# Patient Record
Sex: Male | Born: 2000 | Race: Black or African American | Hispanic: No | Marital: Single | Smoking: Never smoker
Health system: Southern US, Community
[De-identification: ages and names within clinical notes are randomized; demographics above are authoritative.]

## PROBLEM LIST (undated history)

## (undated) DIAGNOSIS — Z969 Presence of functional implant, unspecified: Secondary | ICD-10-CM

---
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
## 2022-05-23 ENCOUNTER — Ambulatory Visit
Admission: EM | Admit: 2022-05-23 | Discharge: 2022-05-23 | Disposition: A | Discharge status: Home / Self Care | Payer: Self-pay | Attending: Internal Medicine | Admitting: Internal Medicine

## 2022-05-23 ENCOUNTER — Ambulatory Visit (INDEPENDENT_AMBULATORY_CARE_PROVIDER_SITE_OTHER): Payer: Self-pay

## 2022-05-23 DIAGNOSIS — S61411A Laceration without foreign body of right hand, initial encounter: Secondary | ICD-10-CM

## 2022-05-23 DIAGNOSIS — Z23 Encounter for immunization: Secondary | ICD-10-CM

## 2022-05-23 DIAGNOSIS — M79641 Pain in right hand: Secondary | ICD-10-CM

## 2022-05-23 MED ORDER — TETANUS-DIPHTH-ACELL PERTUSSIS 5-2.5-18.5 LF-MCG/0.5 IM SUSY
0.5000 mL | PREFILLED_SYRINGE | Freq: Once | INTRAMUSCULAR | Status: AC
  Administered 2022-05-23: 0.5 mL via INTRAMUSCULAR

## 2022-05-23 NOTE — ED Provider Notes (Signed)
[name]    CSN: [id number] Arrival date & time: 05/23/22  1241      History   Chief Complaint Chief Complaint  Patient presents with   Laceration    HPI Gabriel Hughes is a 21 y.o. male.   Patient reports with right hand injury that occurred approximately 1 AM early this morning.  Patient reports that he got angry so he punched a brick wall.  He has a laceration to his right fifth knuckle as well.  Denies any numbness or tingling.  Has full range of motion of hand.  Does not report taking medications to help alleviate symptoms.  Patient is not sure of last tetanus vaccine.   Laceration   Past Medical History:  Diagnosis Date   Retained orthopedic hardware 06/2016   right ankle    Patient Active Problem List   Diagnosis Date Noted   High risk heterosexual behavior 08/09/2019   Open ankle fracture 01/13/2016   ADHD (attention deficit hyperactivity disorder) 04/11/2011    Past Surgical History:  Procedure Laterality Date   CAST APPLICATION Right 8/88/2800   Procedure: CAST APPLICATION;  Surgeon: Melina Schools, MD;  Location: [place];  Service: Orthopedics;  Laterality: Right;   HARDWARE REMOVAL Right 07/03/2016   Procedure: Removal of deep implants Right Ankle;  Surgeon: Wylene Simmer, MD;  Location: [place];  Service: Orthopedics;  Laterality: Right;   I & D EXTREMITY Right 01/13/2016   Procedure: IRRIGATION AND DEBRIDEMENT RIGHT ANKLE;  Surgeon: Melina Schools, MD;  Location: [place];  Service: Orthopedics;  Laterality: Right;   I & D EXTREMITY Right 01/15/2016   Procedure: IRRIGATION AND DEBRIDEMENT OPEN FRACTURE ;  Surgeon: Wylene Simmer, MD;  Location: [place];  Service: Orthopedics;  Laterality: Right;   OPEN REDUCTION INTERNAL FIXATION (ORIF) DISTAL RADIAL FRACTURE Right 01/15/2016   Procedure: OPEN REDUCTION INTERNAL FIXATION (ORIF) RIGHT DISTAL TIBIAL RADIAL FRACTURE;  Surgeon: Wylene Simmer, MD;  Location: [place];  Service: Orthopedics;   Laterality: Right;       Home Medications    Prior to Admission medications   Medication Sig Start Date End Date Taking? Authorizing Provider  amoxicillin (AMOXIL) 500 MG capsule Take 1 capsule (500 mg total) by mouth 3 (three) times daily. 09/20/21   Francene Finders, PA-C  levocetirizine (XYZAL) 5 MG tablet Take 1 tablet (5 mg total) by mouth every evening. 11/28/20   Scot Jun, FNP  promethazine-dextromethorphan (PROMETHAZINE-DM) 6.25-15 MG/5ML syrup Take 5 mLs by mouth 4 (four) times daily as needed for cough. 11/28/20   Scot Jun, FNP    Family History History reviewed. No pertinent family history.  Social History Social History   Tobacco Use   Smoking status: Passive Smoke Exposure - Never Smoker   Smokeless tobacco: Never   Tobacco comments:    father smokes outside  Vaping Use   Vaping Use: Some days  Substance Use Topics   Alcohol use: No   Drug use: Yes    Types: Marijuana     Allergies   Patient has no known allergies.   Review of Systems Review of Systems Per HPI  Physical Exam Triage Vital Signs ED Triage Vitals  Enc Vitals Group     BP 05/23/22 1348 (!) 123/50     Pulse Rate 05/23/22 1348 (!) 52     Resp 05/23/22 1348 19     Temp 05/23/22 1348 97.8 F (36.6 C)     Temp Source 05/23/22 1348 Oral  SpO2 05/23/22 1348 98 %     Weight --      Height --      Head Circumference --      Peak Flow --      Pain Score 05/23/22 1353 3     Pain Loc --      Pain Edu? --      Excl. in GC? --    No data found.  Updated Vital Signs BP (!) 123/50   Pulse (!) 52   Temp 97.8 F (36.6 C) (Oral)   Resp 19   SpO2 98%   Visual Acuity Right Eye Distance:   Left Eye Distance:   Bilateral Distance:    Right Eye Near:   Left Eye Near:    Bilateral Near:     Physical Exam Constitutional:      General: He is not in acute distress.    Appearance: Normal appearance. He is not toxic-appearing or diaphoretic.  HENT:     Head:  Normocephalic and atraumatic.  Eyes:     Extraocular Movements: Extraocular movements intact.     Conjunctiva/sclera: Conjunctivae normal.  Pulmonary:     Effort: Pulmonary effort is normal.  Musculoskeletal:     Comments: Tenderness to palpation overlying right fifth knuckle.  Associated mild swelling as well.  Grip strength 5/5.  Neurovascular intact.  Skin:    Comments: Approximately 1 inch superficial laceration present overlying right fifth knuckle.  No obvious tendons noted. Bleeding is controlled.  Neurological:     General: No focal deficit present.     Mental Status: He is alert and oriented to person, place, and time. Mental status is at baseline.  Psychiatric:        Mood and Affect: Mood normal.        Behavior: Behavior normal.        Thought Content: Thought content normal.        Judgment: Judgment normal.      UC Treatments / Results  Labs (all labs ordered are listed, but only abnormal results are displayed) Labs Reviewed - No data to display  EKG   Radiology DG Hand Complete Right  Result Date: 05/23/2022 CLINICAL DATA:  Hand pain. Laceration to right fifth finger proximal knuckle. Punched a brick wall last night. EXAM: RIGHT HAND - COMPLETE 3+ VIEW COMPARISON:  None Available. FINDINGS: Normal bone mineralization. Joint spaces are preserved. No acute fracture is seen. No dislocation. IMPRESSION: No acute fracture. Electronically Signed   By: Neita Garnet M.D.   On: 05/23/2022 14:28    Procedures Procedures (including critical care time)  Medications Ordered in UC Medications  Tdap (BOOSTRIX) injection 0.5 mL (0.5 mLs Intramuscular Given 05/23/22 1443)    Initial Impression / Assessment and Plan / UC Course  I have reviewed the triage vital signs and the nursing notes.  Pertinent labs & imaging results that were available during my care of the patient were reviewed by me and considered in my medical decision making (see chart for details).     Right  hand x-ray was negative for any acute bony abnormality.  Laceration to right hand is very superficial so no closure is needed.  Wound cleansed and nonadherent dressing applied.  Patient advised to monitor for signs signs of infection and keep covered until healed over.  Advised ice application as well.  Patient was advised to follow-up if any symptoms persist or worsen.  Patient verbalized understanding and was agreeable with plan. Final Clinical Impressions(s) /  UC Diagnoses   Final diagnoses:  Laceration of right hand without foreign body, initial encounter  Right hand pain     Discharge Instructions      Your x-ray was normal.  A dressing has been applied.  Please keep clean and covered until healed over.  Monitor for signs of infection that include increased redness, swelling, pus and follow-up if this occurs.  Recommend ice application as well.  Tetanus vaccine has been updated today.  Please follow-up if symptoms persist or worsen.     ED Prescriptions   None    PDMP not reviewed this encounter.   Gustavus Bryant, [place] 05/23/22 1446

## 2022-05-23 NOTE — Discharge Instructions (Signed)
Your x-ray was normal.  A dressing has been applied.  Please keep clean and covered until healed over.  Monitor for signs of infection that include increased redness, swelling, pus and follow-up if this occurs.  Recommend ice application as well.  Tetanus vaccine has been updated today.  Please follow-up if symptoms persist or worsen.

## 2022-05-23 NOTE — ED Triage Notes (Signed)
Pt presents to uc for laceration to right pinky finger proximal knuckle. Pt reports he was intoxicated last night and punched a brick wall. Pt arrived with bandage in place, removed bandage 2-3 cm lac bleeding controled. Not sure if up to date on tetanus

## 2022-07-01 ENCOUNTER — Encounter: Payer: Self-pay | Admitting: Emergency Medicine

## 2022-07-01 ENCOUNTER — Ambulatory Visit
Admission: EM | Admit: 2022-07-01 | Discharge: 2022-07-01 | Disposition: A | Discharge status: Home / Self Care | Payer: Self-pay | Attending: Physician Assistant | Admitting: Physician Assistant

## 2022-07-01 DIAGNOSIS — R112 Nausea with vomiting, unspecified: Secondary | ICD-10-CM

## 2022-07-01 DIAGNOSIS — R051 Acute cough: Secondary | ICD-10-CM

## 2022-07-01 DIAGNOSIS — J069 Acute upper respiratory infection, unspecified: Secondary | ICD-10-CM

## 2022-07-01 MED ORDER — PROMETHAZINE-DM 6.25-15 MG/5ML PO SYRP: 5.0000 mL | ORAL_SOLUTION | Freq: Four times a day (QID) | ORAL | 0 refills | Status: AC | PRN

## 2022-07-01 NOTE — Discharge Instructions (Signed)
Advised to take the Phenergan DM, 1 to 2 teaspoons every 6-8 hours as needed for cough congestion and nausea.  Advised take ibuprofen or Tylenol as needed for discomfort or fever.  Advised to follow-up with PCP or return to urgent care if symptoms fail to improve.

## 2022-07-01 NOTE — ED Triage Notes (Signed)
Pt is present today with HA, cough, abdominal pain and congestion x3 days

## 2022-07-01 NOTE — ED Provider Notes (Signed)
[name]    CSN: [id number] Arrival date & time: 07/01/22  [id number]      History   Chief Complaint Chief Complaint  Patient presents with   Headache   Cough    congestion    HPI Gabriel Hughes is a 21 y.o. male.   21 year old male presents with cough and nausea.  Patient indicates for the past 3 days he has been having some upper respiratory congestion with rhinitis and postnasal drip mainly clear to yellow production.  Patient also indicates he has been having some chest congestion with intermittent cough with production being clear to yellow.  Patient also indicates he has been having some stomach cramping with nausea and 1 episode of vomiting which happened 2 days ago.  He also indicates that he has been having some diarrhea associated with the nausea over the past couple days.  He has not been having any fever but he has been having some chills and fatigue.  Patient indicates he has been using some Mucinex DM OTC without much improvement of his symptoms.  Patient indicates he is tolerating fluids well   Headache Associated symptoms: cough, diarrhea and nausea   Cough Associated symptoms: headaches     Past Medical History:  Diagnosis Date   Retained orthopedic hardware 06/2016   right ankle    Patient Active Problem List   Diagnosis Date Noted   High risk heterosexual behavior 08/09/2019   Open ankle fracture 01/13/2016   ADHD (attention deficit hyperactivity disorder) 04/11/2011    Past Surgical History:  Procedure Laterality Date   CAST APPLICATION Right 0000000   Procedure: CAST APPLICATION;  Surgeon: Melina Schools, MD;  Location: [place];  Service: Orthopedics;  Laterality: Right;   HARDWARE REMOVAL Right 07/03/2016   Procedure: Removal of deep implants Right Ankle;  Surgeon: Wylene Simmer, MD;  Location: [place];  Service: Orthopedics;  Laterality: Right;   I & D EXTREMITY Right 01/13/2016   Procedure: IRRIGATION AND DEBRIDEMENT  RIGHT ANKLE;  Surgeon: Melina Schools, MD;  Location: [place];  Service: Orthopedics;  Laterality: Right;   I & D EXTREMITY Right 01/15/2016   Procedure: IRRIGATION AND DEBRIDEMENT OPEN FRACTURE ;  Surgeon: Wylene Simmer, MD;  Location: [place];  Service: Orthopedics;  Laterality: Right;   OPEN REDUCTION INTERNAL FIXATION (ORIF) DISTAL RADIAL FRACTURE Right 01/15/2016   Procedure: OPEN REDUCTION INTERNAL FIXATION (ORIF) RIGHT DISTAL TIBIAL RADIAL FRACTURE;  Surgeon: Wylene Simmer, MD;  Location: [place];  Service: Orthopedics;  Laterality: Right;       Home Medications    Prior to Admission medications   Medication Sig Start Date End Date Taking? Authorizing Provider  amoxicillin (AMOXIL) 500 MG capsule Take 1 capsule (500 mg total) by mouth 3 (three) times daily. 09/20/21   Francene Finders, PA-C  levocetirizine (XYZAL) 5 MG tablet Take 1 tablet (5 mg total) by mouth every evening. 11/28/20   Scot Jun, FNP  promethazine-dextromethorphan (PROMETHAZINE-DM) 6.25-15 MG/5ML syrup Take 5 mLs by mouth 4 (four) times daily as needed for cough. 07/01/22   Nyoka Lint, PA-C    Family History History reviewed. No pertinent family history.  Social History Social History   Tobacco Use   Smoking status: Passive Smoke Exposure - Never Smoker   Smokeless tobacco: Never   Tobacco comments:    father smokes outside  Vaping Use   Vaping Use: Some days  Substance Use Topics   Alcohol use: No   Drug use: Yes  Types: Marijuana     Allergies   Patient has no known allergies.   Review of Systems Review of Systems  Respiratory:  Positive for cough.   Gastrointestinal:  Positive for diarrhea and nausea.  Neurological:  Positive for headaches.     Physical Exam Triage Vital Signs ED Triage Vitals  Enc Vitals Group     BP 07/01/22 0919 133/82     Pulse Rate 07/01/22 0919 (!) 52     Resp 07/01/22 0919 17     Temp 07/01/22 0919 98.3 F (36.8 C)     Temp src --      SpO2 07/01/22 0919 98  %     Weight --      Height --      Head Circumference --      Peak Flow --      Pain Score 07/01/22 0918 5     Pain Loc --      Pain Edu? --      Excl. in GC? --    No data found.  Updated Vital Signs BP 133/82   Pulse (!) 52   Temp 98.3 F (36.8 C)   Resp 17   SpO2 98%   Visual Acuity Right Eye Distance:   Left Eye Distance:   Bilateral Distance:    Right Eye Near:   Left Eye Near:    Bilateral Near:     Physical Exam Constitutional:      Appearance: He is well-developed.  HENT:     Right Ear: Tympanic membrane and ear canal normal.     Left Ear: Tympanic membrane and ear canal normal.     Mouth/Throat:     Mouth: Mucous membranes are moist.     Pharynx: Oropharynx is clear. No posterior oropharyngeal erythema.  Cardiovascular:     Rate and Rhythm: Normal rate and regular rhythm.     Heart sounds: Normal heart sounds.  Pulmonary:     Effort: Pulmonary effort is normal.     Breath sounds: Normal breath sounds and air entry. No wheezing, rhonchi or rales.  Abdominal:     General: Abdomen is flat. Bowel sounds are increased.     Palpations: Abdomen is soft.     Tenderness: There is no abdominal tenderness. There is no guarding or rebound.  Lymphadenopathy:     Cervical: No cervical adenopathy.  Neurological:     Mental Status: He is alert.      UC Treatments / Results  Labs (all labs ordered are listed, but only abnormal results are displayed) Labs Reviewed - No data to display  EKG   Radiology No results found.  Procedures Procedures (including critical care time)  Medications Ordered in UC Medications - No data to display  Initial Impression / Assessment and Plan / UC Course  I have reviewed the triage vital signs and the nursing notes.  Pertinent labs & imaging results that were available during my care of the patient were reviewed by me and considered in my medical decision making (see chart for details).    Plan: 1.  The acute upper  respiratory infection be treated with the following: A.  Phenergan DM, 1 to 2 teaspoons every 6-8 hours needed for cough and congestion. 2.  The nausea and vomiting will be treated with the following: A.  Phenergan DM 1 to 2 teaspoons every 6-8 hours needed for cough and congestion and to control the nausea. 3.  The acute cough will be treated  with the following: A.  Phenergan DM, 1 to 2 teaspoons every 6-8 hours needed for cough or congestion. 4.  Advised follow-up PCP or return to urgent care if symptoms fail to improve. Final Clinical Impressions(s) / UC Diagnoses   Final diagnoses:  Acute upper respiratory infection  Nausea and vomiting, unspecified vomiting type  Acute cough     Discharge Instructions      Advised to take the Phenergan DM, 1 to 2 teaspoons every 6-8 hours as needed for cough congestion and nausea.  Advised take ibuprofen or Tylenol as needed for discomfort or fever.  Advised to follow-up with PCP or return to urgent care if symptoms fail to improve.     ED Prescriptions     Medication Sig Dispense Auth. Provider   promethazine-dextromethorphan (PROMETHAZINE-DM) 6.25-15 MG/5ML syrup Take 5 mLs by mouth 4 (four) times daily as needed for cough. 120 mL Ellsworth Lennox, PA-C      PDMP not reviewed this encounter.   Ellsworth Lennox, PA-C 07/01/22 [phone]

## 2022-07-27 ENCOUNTER — Ambulatory Visit
Admission: EM | Admit: 2022-07-27 | Discharge: 2022-07-27 | Disposition: A | Discharge status: Home / Self Care | Payer: Self-pay | Attending: Nurse Practitioner | Admitting: Nurse Practitioner

## 2022-07-27 DIAGNOSIS — Z1152 Encounter for screening for COVID-19: Secondary | ICD-10-CM | POA: Insufficient documentation

## 2022-07-27 DIAGNOSIS — J069 Acute upper respiratory infection, unspecified: Secondary | ICD-10-CM | POA: Insufficient documentation

## 2022-07-27 MED ORDER — BENZONATATE 200 MG PO CAPS: 200.0000 mg | ORAL_CAPSULE | Freq: Three times a day (TID) | ORAL | 0 refills | Status: AC | PRN

## 2022-07-27 NOTE — ED Provider Notes (Signed)
Gabriel Hughes    CSN: [id number] Arrival date & time: 07/27/22  1128      History   Chief Complaint Chief Complaint  Patient presents with   URI    HPI Gabriel Hughes is a 22 y.o. male who presents for evaluation of URI symptoms for 4 days. Patient reports associated symptoms of cough, congestion, postnasal drip, sore throat. Denies N/V/D, ears, ear pain, shortness of breath. Patient does not have a hx of asthma.  He is an active smoker.   No known sick contacts and no recent travel. Pt is vaccinated for COVID. Pt is vaccinated for flu this season. Pt has taken the musinex OTC for symptoms. Pt has no other concerns at this time.    URI Presenting symptoms: congestion, cough and sore throat     Past Medical History:  Diagnosis Date   Retained orthopedic hardware 06/2016   right ankle    Patient Active Problem List   Diagnosis Date Noted   High risk heterosexual behavior 08/09/2019   Open ankle fracture 01/13/2016   ADHD (attention deficit hyperactivity disorder) 04/11/2011    Past Surgical History:  Procedure Laterality Date   CAST APPLICATION Right 01/13/2016   Procedure: CAST APPLICATION;  Surgeon: Venita Lick, MD;  Location[facility];  Service: Orthopedics;  Laterality: Right;   HARDWARE REMOVAL Right 07/03/2016   Procedure: Removal of deep implants Right Ankle;  Surgeon: Toni Arthurs, MD;  Location: [facility];  Service: Orthopedics;  Laterality: Right;   I & D EXTREMITY Right 01/13/2016   Procedure: IRRIGATION AND DEBRIDEMENT RIGHT ANKLE;  Surgeon: Venita Lick, MD;  Location: [facility];  Service: Orthopedics;  Laterality: Right;   I & D EXTREMITY Right 01/15/2016   Procedure: IRRIGATION AND DEBRIDEMENT OPEN FRACTURE ;  Surgeon: Toni Arthurs, MD;  Location[facility];  Service: Orthopedics;  Laterality: Right;   OPEN REDUCTION INTERNAL FIXATION (ORIF) DISTAL RADIAL FRACTURE Right 01/15/2016   Procedure: OPEN REDUCTION INTERNAL FIXATION (ORIF) RIGHT  DISTAL TIBIAL RADIAL FRACTURE;  Surgeon: Toni Arthurs, MD;  Location: [facility];  Service: Orthopedics;  Laterality: Right;       Home Medications    Prior to Admission medications   Medication Sig Start Date End Date Taking? Authorizing Provider  benzonatate (TESSALON) 200 MG capsule Take 1 capsule (200 mg total) by mouth 3 (three) times daily as needed for cough. 07/27/22  Yes Radford Pax, NP  amoxicillin (AMOXIL) 500 MG capsule Take 1 capsule (500 mg total) by mouth 3 (three) times daily. 09/20/21   Tomi Bamberger, PA-C  levocetirizine (XYZAL) 5 MG tablet Take 1 tablet (5 mg total) by mouth every evening. 11/28/20   Bing Neighbors, FNP  promethazine-dextromethorphan (PROMETHAZINE-DM) 6.25-15 MG/5ML syrup Take 5 mLs by mouth 4 (four) times daily as needed for cough. 07/01/22   Ellsworth Lennox, PA-C    Family History History reviewed. No pertinent family history.  Social History Social History   Tobacco Use   Smoking status: Passive Smoke Exposure - Never Smoker   Smokeless tobacco: Never   Tobacco comments:    father smokes outside  Vaping Use   Vaping Use: Some days  Substance Use Topics   Alcohol use: No   Drug use: Yes    Types: Marijuana     Allergies   Patient has no known allergies.   Review of Systems Review of Systems  HENT:  Positive for congestion and sore throat.   Respiratory:  Positive for cough.  Physical Exam Triage Vital Signs ED Triage Vitals  Enc Vitals Group     BP 07/27/22 1152 119/67     Pulse Rate 07/27/22 1151 (!) 56     Resp --      Temp 07/27/22 1151 97.9 F (36.6 C)     Temp Source 07/27/22 1151 Oral     SpO2 07/27/22 1151 97 %     Weight --      Height --      Head Circumference --      Peak Flow --      Pain Score 07/27/22 1150 4     Pain Loc --      Pain Edu? --      Excl. in GC? --    No data found.  Updated Vital Signs BP 119/67   Pulse (!) 56   Temp 97.9 F (36.6 C) (Oral)   SpO2 97%   Visual Acuity Right  Eye Distance:   Left Eye Distance:   Bilateral Distance:    Right Eye Near:   Left Eye Near:    Bilateral Near:     Physical Exam Vitals and nursing note reviewed.  Constitutional:      General: He is not in acute distress.    Appearance: Normal appearance. He is not ill-appearing or toxic-appearing.  HENT:     Head: Normocephalic and atraumatic.     Right Ear: Tympanic membrane and ear canal normal.     Left Ear: Tympanic membrane and ear canal normal.     Nose: Congestion present.     Mouth/Throat:     Mouth: Mucous membranes are moist.     Pharynx: Posterior oropharyngeal erythema present.  Eyes:     Pupils: Pupils are equal, round, and reactive to light.  Cardiovascular:     Rate and Rhythm: Normal rate and regular rhythm.     Heart sounds: Normal heart sounds.  Pulmonary:     Effort: Pulmonary effort is normal.     Breath sounds: Normal breath sounds.  Musculoskeletal:     Cervical back: Normal range of motion and neck supple.  Lymphadenopathy:     Cervical: No cervical adenopathy.  Skin:    General: Skin is warm and dry.  Neurological:     General: No focal deficit present.     Mental Status: He is alert and oriented to person, place, and time.  Psychiatric:        Mood and Affect: Mood normal.        Behavior: Behavior normal.      UC Treatments / Results  Labs (all labs ordered are listed, but only abnormal results are displayed) Labs Reviewed  SARS CORONAVIRUS 2 (TAT 6-24 HRS)    EKG   Radiology No results found.  Procedures Procedures (including critical Hughes time)  Medications Ordered in UC Medications - No data to display  Initial Impression / Assessment and Plan / UC Course  I have reviewed the triage vital signs and the nursing notes.  Pertinent labs & imaging results that were available during my Hughes of the patient were reviewed by me and considered in my medical decision making (see chart for details).     Discussed with patient  viral illness and symptomatic treatment  COVID PCR [place].  Cough Rest and fluids Follow up with PCP in 2-3 days for re-check  ER precautions reviewed and pt verbalized understanding  Final Clinical Impressions(s) / UC Diagnoses   Final diagnoses:  Viral  upper respiratory tract infection     Discharge Instructions      Your symptoms and exam are consistent for a viral illness. Please treat your symptoms with over the counter tylenol or ibuprofen, humidifier, and rest. Viral illnesses can last 7-14 days. Please follow up with your PCP if your symptoms are not improving. Please go to the ER for any worsening symptoms. This includes but is not limited to fever you can not control with tylenol or ibuprofen, you are not able to stay hydrated, you have shortness of breath or chest pain.  Thank you for choosing [facility] for your healthcare needs. I hope you feel better soon!    ED Prescriptions     Medication Sig Dispense Auth. Provider   benzonatate (TESSALON) 200 MG capsule Take 1 capsule (200 mg total) by mouth 3 (three) times daily as needed for cough. 20 capsule Melynda Ripple, NP      PDMP not reviewed this encounter.   Melynda Ripple, NP 07/27/22 [phone]

## 2022-07-27 NOTE — Discharge Instructions (Signed)
Your symptoms and exam are consistent for a viral illness. Please treat your symptoms with over the counter tylenol or ibuprofen, humidifier, and rest. Viral illnesses can last 7-14 days. Please follow up with your PCP if your symptoms are not improving. Please go to the ER for any worsening symptoms. This includes but is not limited to fever you can not control with tylenol or ibuprofen, you are not able to stay hydrated, you have shortness of breath or chest pain.  Thank you for choosing [facility] for your healthcare needs. I hope you feel better soon!  

## 2022-07-27 NOTE — ED Triage Notes (Signed)
Pt presents with sinus congestion and productive cough X 4 days.

## 2022-07-28 LAB — SARS CORONAVIRUS 2 (TAT 6-24 HRS): SARS Coronavirus 2: NEGATIVE

## 2022-07-29 ENCOUNTER — Encounter (HOSPITAL_COMMUNITY): Payer: Self-pay

## 2022-07-29 ENCOUNTER — Emergency Department (HOSPITAL_COMMUNITY)
Admission: EM | Admit: 2022-07-29 | Discharge: 2022-07-29 | Disposition: A | Discharge status: Home / Self Care | Payer: Self-pay | Attending: Emergency Medicine | Admitting: Emergency Medicine

## 2022-07-29 ENCOUNTER — Other Ambulatory Visit: Payer: Self-pay

## 2022-07-29 DIAGNOSIS — R197 Diarrhea, unspecified: Secondary | ICD-10-CM

## 2022-07-29 DIAGNOSIS — K529 Noninfective gastroenteritis and colitis, unspecified: Secondary | ICD-10-CM | POA: Insufficient documentation

## 2022-07-29 DIAGNOSIS — Z1152 Encounter for screening for COVID-19: Secondary | ICD-10-CM | POA: Insufficient documentation

## 2022-07-29 LAB — URINALYSIS, ROUTINE W REFLEX MICROSCOPIC
Bacteria, UA: NONE SEEN
Bilirubin Urine: NEGATIVE
Glucose, UA: NEGATIVE mg/dL
Hgb urine dipstick: NEGATIVE
Ketones, ur: NEGATIVE mg/dL
Leukocytes,Ua: NEGATIVE
Nitrite: NEGATIVE
Protein, ur: 30 mg/dL — AB
Specific Gravity, Urine: 1.023 (ref 1.005–1.030)
pH: 8 (ref 5.0–8.0)

## 2022-07-29 LAB — COMPREHENSIVE METABOLIC PANEL
ALT: 19 U/L (ref 0–44)
AST: 29 U/L (ref 15–41)
Albumin: 4.3 g/dL (ref 3.5–5.0)
Alkaline Phosphatase: 58 U/L (ref 38–126)
Anion gap: 10 (ref 5–15)
BUN: 14 mg/dL (ref 6–20)
CO2: 23 mmol/L (ref 22–32)
Calcium: 9.3 mg/dL (ref 8.9–10.3)
Chloride: 105 mmol/L (ref 98–111)
Creatinine, Ser: 1.01 mg/dL (ref 0.61–1.24)
GFR, Estimated: >60 mL/min (ref >60)
Glucose, Bld: 103 mg/dL — ABNORMAL HIGH (ref 70–99)
Potassium: 4.1 mmol/L (ref 3.5–5.1)
Sodium: 138 mmol/L (ref 135–145)
Total Bilirubin: 1.4 mg/dL — ABNORMAL HIGH (ref 0.3–1.2)
Total Protein: 7.1 g/dL (ref 6.5–8.1)

## 2022-07-29 LAB — CBC
HCT: 48.8 % (ref 39.0–52.0)
Hemoglobin: 16 g/dL (ref 13.0–17.0)
MCH: 30.5 pg (ref 26.0–34.0)
MCHC: 32.8 g/dL (ref 30.0–36.0)
MCV: 93.1 fL (ref 80.0–100.0)
Platelets: 143 10*3/uL — ABNORMAL LOW (ref 150–400)
RBC: 5.24 MIL/uL (ref 4.22–5.81)
RDW: 12.1 % (ref 11.5–15.5)
WBC: 7 10*3/uL (ref 4.0–10.5)
nRBC: 0 % (ref 0.0–0.2)

## 2022-07-29 LAB — RESP PANEL BY RT-PCR (RSV, FLU A&B, COVID)  RVPGX2
Influenza A by PCR: NEGATIVE
Influenza B by PCR: NEGATIVE
Resp Syncytial Virus by PCR: NEGATIVE
SARS Coronavirus 2 by RT PCR: NEGATIVE

## 2022-07-29 LAB — LIPASE, BLOOD: Lipase: 39 U/L (ref 11–51)

## 2022-07-29 MED ORDER — ONDANSETRON HCL 4 MG PO TABS: 4.0000 mg | ORAL_TABLET | Freq: Four times a day (QID) | ORAL | 0 refills | Status: AC

## 2022-07-29 MED ORDER — ONDANSETRON 4 MG PO TBDP
4.0000 mg | ORAL_TABLET | Freq: Once | ORAL | Status: AC
  Administered 2022-07-29: 4 mg via ORAL
  Filled 2022-07-29: qty 1

## 2022-07-29 MED ORDER — ONDANSETRON HCL 4 MG PO TABS: 4.0000 mg | ORAL_TABLET | Freq: Four times a day (QID) | ORAL | 0 refills | Status: DC

## 2022-07-29 MED ORDER — ACETAMINOPHEN 325 MG PO TABS
650.0000 mg | ORAL_TABLET | Freq: Once | ORAL | Status: AC
  Administered 2022-07-29: 650 mg via ORAL
  Filled 2022-07-29: qty 2

## 2022-07-29 MED ORDER — DICYCLOMINE HCL 20 MG PO TABS: 20.0000 mg | ORAL_TABLET | Freq: Two times a day (BID) | ORAL | 0 refills | Status: AC

## 2022-07-29 MED ORDER — DICYCLOMINE HCL 20 MG PO TABS: 20.0000 mg | ORAL_TABLET | Freq: Two times a day (BID) | ORAL | 0 refills | Status: DC

## 2022-07-29 MED ORDER — DICYCLOMINE HCL 20 MG PO TABS
20.0000 mg | ORAL_TABLET | Freq: Once | ORAL | Status: AC
  Administered 2022-07-29: 20 mg via ORAL
  Filled 2022-07-29: qty 1

## 2022-07-29 NOTE — ED Provider Triage Note (Signed)
Emergency Medicine Provider Triage Evaluation Note  Gabriel Hughes , a 22 y.o. male  was evaluated in triage.  Pt complains of cough and abdominal pain   Review of Systems  Positive: Cough and abdominal pain Negative:   Physical Exam  BP 111/60 (BP Location: Right Arm)   Pulse 72   Temp 98.1 F (36.7 C)   Resp 16   Ht 5\' 7"  (1.702 m)   Wt 55.2 kg   SpO2 100%   BMI 19.06 kg/m  Gen:   Awake, no distress  Resp:  Normal effort  MSK:   Moves extremities without difficulty  Other:    Medical Decision Making  Medically screening exam initiated at 2:30 PM.  Appropriate orders placed.  Gabriel Hughes was informed that the remainder of the evaluation will be completed by another provider, this initial triage assessment does not replace that evaluation, and the importance of remaining in the ED until their evaluation is complete.     Gabriel Hughes, [place] 07/29/22 1431

## 2022-07-29 NOTE — ED Triage Notes (Signed)
Pt c/o vomiting this morning. Chills, abdominal pain, nasal congestionx4-5d. Pt vomited on the floor in triage, because he couldn't make it to his emesis bag.

## 2022-07-29 NOTE — Discharge Instructions (Addendum)
You were seen in the emergency department for your vomiting and your diarrhea.  Your workup showed no signs of severe infection or dehydration.  You likely have a viral infection causing your symptoms so we did send a urine drug test for you today and you can follow this up on your patient portal.  You can continue to take Zofran as needed for nausea and Tylenol or Bentyl as needed for your abdominal pain.  You should follow-up with your primary doctor in the next few days to have your symptoms rechecked.  You should return to the emergency department if you are having fevers, repetitive vomiting despite the nausea medication, significantly worsening abdominal pain or any other new or concerning symptoms.
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
## 2022-09-18 ENCOUNTER — Encounter: Payer: Self-pay | Admitting: Radiology

## 2022-10-25 ENCOUNTER — Ambulatory Visit
Admission: EM | Admit: 2022-10-25 | Discharge: 2022-10-25 | Disposition: A | Discharge status: Home / Self Care | Payer: Self-pay | Attending: Internal Medicine | Admitting: Internal Medicine

## 2022-10-25 DIAGNOSIS — Z20822 Contact with and (suspected) exposure to covid-19: Secondary | ICD-10-CM | POA: Insufficient documentation

## 2022-10-25 DIAGNOSIS — R110 Nausea: Secondary | ICD-10-CM | POA: Insufficient documentation

## 2022-10-25 DIAGNOSIS — J029 Acute pharyngitis, unspecified: Secondary | ICD-10-CM | POA: Insufficient documentation

## 2022-10-25 DIAGNOSIS — B349 Viral infection, unspecified: Secondary | ICD-10-CM | POA: Insufficient documentation

## 2022-10-25 LAB — POCT INFLUENZA A/B
Influenza A, POC: NEGATIVE
Influenza B, POC: NEGATIVE

## 2022-10-25 LAB — POCT RAPID STREP A (OFFICE): Rapid Strep A Screen: NEGATIVE

## 2022-10-25 MED ORDER — ONDANSETRON 4 MG PO TBDP: 4.0000 mg | ORAL_TABLET | Freq: Three times a day (TID) | ORAL | 0 refills | Status: DC | PRN

## 2022-10-25 NOTE — ED Provider Notes (Signed)
[name]    CSN: [id number] Arrival date & time: 10/25/22  1043      History   Chief Complaint Chief Complaint  Patient presents with   Headache   Abdominal Pain   Cough    HPI Gabriel Hughes is a 22 y.o. male.   Patient presents with cough, headache, nausea, diarrhea, sore throat, nasal congestion that started about 2 days ago.  Reports known sick contact with similar symptoms.  Denies any fever at home.  Denies chest pain, shortness of breath, blood in stool or emesis, ear pain.  Patient has taken Tylenol for symptoms.  Patient has been able to keep food and fluids down.   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   H/O keloid of skin 05/08/2020   High risk heterosexual behavior 08/09/2019   Open ankle fracture 01/13/2016   ADHD (attention deficit hyperactivity disorder) 04/11/2011    Past Surgical History:  Procedure Laterality Date   CAST APPLICATION Right 01/13/2016   Procedure: CAST APPLICATION;  Surgeon: Venita Lickahari Brooks, MD;  [facility];  Service: Orthopedics;  Laterality: Right;   HARDWARE REMOVAL Right 07/03/2016   Procedure: Removal of deep implants Right Ankle;  Surgeon: Toni ArthursJohn Hewitt, MD;  Location: [facility];  Service: Orthopedics;  Laterality: Right;   I & D EXTREMITY Right 01/13/2016   Procedure: IRRIGATION AND DEBRIDEMENT RIGHT ANKLE;  Surgeon: Venita Lickahari Brooks, MD;  Location: [facility] OR;  Service: Orthopedics;  Laterality: Right;   I & D EXTREMITY Right 01/15/2016   Procedure: IRRIGATION AND DEBRIDEMENT OPEN FRACTURE ;  Surgeon: Toni ArthursJohn  Hewitt, MD;  Location: [facility] OR;  Service: Orthopedics;  Laterality: Right;   OPEN REDUCTION INTERNAL FIXATION (ORIF) DISTAL RADIAL FRACTURE Right 01/15/2016   Procedure: OPEN REDUCTION INTERNAL FIXATION (ORIF) RIGHT DISTAL TIBIAL RADIAL FRACTURE;  Surgeon: Toni ArthursJohn Hewitt, MD;  Location: [facility] OR;  Service: Orthopedics;  Laterality: Right;       Home Medications    Prior to Admission medications   Medication Sig Start Date End Date Taking? Authorizing Provider  ondansetron (ZOFRAN-ODT) 4 MG disintegrating tablet Take 1 tablet (4 mg total) by mouth every 8 (eight) hours as needed for nausea or vomiting. 10/25/22  Yes Jaedan Huttner, Rolly SalterHaley E, FNP  amoxicillin (AMOXIL) 500 MG capsule Take 1 capsule (500 mg total) by mouth 3 (three) times daily. 09/20/21   Tomi BambergerMyers, Rebecca F, PA-C  benzonatate (TESSALON) 200 MG capsule Take 1 capsule (200 mg total) by mouth 3 (three) times daily as needed for cough. 07/27/22   Radford PaxMayer, Jodi R, NP  dicyclomine (BENTYL) 20 MG tablet Take 1 tablet (20 mg total) by mouth 2 (two) times daily. 07/29/22   Rexford MausKingsley, Victoria K, DO  levocetirizine (XYZAL) 5 MG tablet Take 1 tablet (5 mg total) by mouth every evening. 11/28/20   Bing NeighborsHarris, Kimberly S, NP  ondansetron (ZOFRAN) 4 MG tablet Take 1 tablet (4 mg total) by mouth every 6 (six) hours. 07/29/22   Rexford MausKingsley, Victoria K, DO  promethazine-dextromethorphan (PROMETHAZINE-DM) 6.25-15 MG/5ML syrup Take 5 mLs by mouth 4 (four) times daily as needed for cough. 07/01/22   Ellsworth LennoxJames, David,  PA-C    Family History History reviewed. No pertinent family history.  Social History Social History   Tobacco Use   Smoking status: Never    Passive exposure: Yes   Smokeless tobacco: Never   Tobacco comments:    father smokes outside  Vaping Use   Vaping Use: Some days  Substance Use Topics   Alcohol use: No   Drug use: Yes    Types: Marijuana    Comment: 3 times a week     Allergies   Patient has no known allergies.   Review of Systems Review of  Systems Per HPI  Physical Exam Triage Vital Signs ED Triage Vitals  Enc Vitals Group     BP 10/25/22 1121 112/75     Pulse Rate 10/25/22 1121 60     Resp 10/25/22 1121 19     Temp 10/25/22 1121 98.2 F (36.8 C)     Temp src --      SpO2 10/25/22 1121 98 %     Weight 10/25/22 1118 115 lb (52.2 kg)     Height --      Head Circumference --      Peak Flow --      Pain Score 10/25/22 1118 8     Pain Loc --      Pain Edu? --      Excl. in GC? --    No data found.  Updated Vital Signs BP 112/75   Pulse 60   Temp 98.2 F (36.8 C)   Resp 19   Wt 115 lb (52.2 kg)   SpO2 98%   BMI 18.01 kg/m   Visual Acuity Right Eye Distance:   Left Eye Distance:   Bilateral Distance:    Right Eye Near:   Left Eye Near:    Bilateral Near:     Physical Exam Constitutional:      General: He is not in acute distress.    Appearance: Normal appearance. He is not toxic-appearing or diaphoretic.  HENT:     Head: Normocephalic and atraumatic.     Right Ear: Tympanic membrane and ear canal normal.     Left Ear: Tympanic membrane and ear canal normal.     Nose: Congestion present.     Mouth/Throat:     Mouth: Mucous membranes are moist.     Pharynx: Posterior oropharyngeal erythema present.  Eyes:     Extraocular Movements: Extraocular movements intact.     Conjunctiva/sclera: Conjunctivae normal.     Pupils: Pupils are equal, round, and reactive to light.  Cardiovascular:     Rate and Rhythm: Normal rate and regular rhythm.     Pulses: Normal pulses.     Heart sounds: Normal heart sounds.  Pulmonary:     Effort: Pulmonary effort is normal. No respiratory distress.     Breath sounds: Normal breath sounds. No stridor. No wheezing, rhonchi or rales.  Abdominal:     General: Abdomen is flat. Bowel sounds are normal. There is no distension.     Palpations: Abdomen is soft.     Tenderness: There is no abdominal tenderness.  Musculoskeletal:        General: Normal range of motion.      Cervical back: Normal range of motion.  Skin:    General: Skin is warm and dry.  Neurological:     General: No focal deficit present.     Mental Status: He is alert and oriented to person, place, and time. Mental  status is at baseline.  Psychiatric:        Mood and Affect: Mood normal.        Behavior: Behavior normal.      UC Treatments / Results  Labs (all labs ordered are listed, but only abnormal results are displayed) Labs Reviewed  SARS CORONAVIRUS 2 (TAT 6-24 HRS)  POCT INFLUENZA A/B  POCT RAPID STREP A (OFFICE)    EKG   Radiology No results found.  Procedures Procedures (including critical care time)  Medications Ordered in UC Medications - No data to display  Initial Impression / Assessment and Plan / UC Course  I have reviewed the triage vital signs and the nursing notes.  Pertinent labs & imaging results that were available during my care of the patient were reviewed by me and considered in my medical decision making (see chart for details).     Suspect viral cause of symptoms.  Rapid strep and rapid flu were negative.  Throat culture and COVID test pending.  Advised supportive care, symptom management, bland diet, adequate fluid hydration, rest.  Will prescribe ondansetron to take as needed for nausea as patient denies that he takes any daily medications so that should be safe.  Advised strict return precautions.  Patient verbalized understanding and was agreeable with plan. Final Clinical Impressions(s) / UC Diagnoses   Final diagnoses:  Sore throat  Encounter for laboratory testing for COVID-19 virus  Viral illness  Nausea without vomiting     Discharge Instructions      Rapid strep and rapid flu are negative.  Throat culture and COVID test are pending.  Will call if they are abnormal.  As we discussed, it appears that you have a viral illness that should run its course.  I have prescribed a nausea medication to take as needed.  Ensure adequate  fluid hydration and rest.  Follow-up if any symptoms persist or worsen.    ED Prescriptions     Medication Sig Dispense Auth. Provider   ondansetron (ZOFRAN-ODT) 4 MG disintegrating tablet Take 1 tablet (4 mg total) by mouth every 8 (eight) hours as needed for nausea or vomiting. 20 tablet [place], Acie Fredrickson, [place]      PDMP not reviewed this encounter.   Gustavus Bryant, [place] 10/25/22 [phone]

## 2022-10-25 NOTE — Discharge Instructions (Signed)
Rapid strep and rapid flu are negative.  Throat culture and COVID test are pending.  Will call if they are abnormal.  As we discussed, it appears that you have a viral illness that should run its course.  I have prescribed a nausea medication to take as needed.  Ensure adequate fluid hydration and rest.  Follow-up if any symptoms persist or worsen.

## 2022-10-25 NOTE — ED Triage Notes (Addendum)
Pt presents to [facility] with co of cough, ha and abd pain for 2 days. Mild nausea 2 days ago and sore throat.Pt reports tylenol at home. Pt report his aunt was recently sick and he thinks he got sick from her.

## 2022-10-26 LAB — SARS CORONAVIRUS 2 (TAT 6-24 HRS): SARS Coronavirus 2: NEGATIVE

## 2022-10-28 LAB — CULTURE, GROUP A STREP (THRC)

## 2022-11-06 ENCOUNTER — Emergency Department (HOSPITAL_COMMUNITY)
Admission: EM | Admit: 2022-11-06 | Discharge: 2022-11-06 | Disposition: A | Discharge status: Home / Self Care | Payer: Medicaid Other | Attending: Emergency Medicine | Admitting: Emergency Medicine

## 2022-11-06 ENCOUNTER — Other Ambulatory Visit: Payer: Self-pay

## 2022-11-06 ENCOUNTER — Emergency Department (HOSPITAL_COMMUNITY): Payer: Medicaid Other

## 2022-11-06 ENCOUNTER — Encounter (HOSPITAL_COMMUNITY): Payer: Self-pay

## 2022-11-06 DIAGNOSIS — S0081XA Abrasion of other part of head, initial encounter: Secondary | ICD-10-CM | POA: Diagnosis not present

## 2022-11-06 DIAGNOSIS — W01198A Fall on same level from slipping, tripping and stumbling with subsequent striking against other object, initial encounter: Secondary | ICD-10-CM | POA: Diagnosis not present

## 2022-11-06 DIAGNOSIS — R519 Headache, unspecified: Secondary | ICD-10-CM | POA: Diagnosis present

## 2022-11-06 DIAGNOSIS — S0990XA Unspecified injury of head, initial encounter: Secondary | ICD-10-CM

## 2022-11-06 DIAGNOSIS — T148XXA Other injury of unspecified body region, initial encounter: Secondary | ICD-10-CM

## 2022-11-06 MED ORDER — IBUPROFEN 400 MG PO TABS
600.0000 mg | ORAL_TABLET | Freq: Once | ORAL | Status: AC
  Administered 2022-11-06: 600 mg via ORAL
  Filled 2022-11-06: qty 1

## 2022-11-06 NOTE — ED Provider Notes (Signed)
[place] [facility] AT [facility] Provider Note   CSN: [id number] Arrival date & time: 11/06/22  1308     History  Chief Complaint  Patient presents with   Head Injury    Gabriel Hughes is a 22 y.o. male.  HPI     This is a 22 year old male who presents with concerns for head injury.  Patient states that he tripped and fell 2 days ago.  He hit his forehead.  He did not lose consciousness.  He is not on blood thinners.  Since that time he has had persistent headache.  No nausea or vomiting.  He is concerned he may have a concussion.  He has not taken anything for his symptoms.  Denies any other injury.  Home Medications Prior to Admission medications   Medication Sig Start Date End Date Taking? Authorizing Provider  amoxicillin (AMOXIL) 500 MG capsule Take 1 capsule (500 mg total) by mouth 3 (three) times daily. 09/20/21   Tomi Bamberger, PA-C  benzonatate (TESSALON) 200 MG capsule Take 1 capsule (200 mg total) by mouth 3 (three) times daily as needed for cough. 07/27/22   Radford Pax, NP  dicyclomine (BENTYL) 20 MG tablet Take 1 tablet (20 mg total) by mouth 2 (two) times daily. 07/29/22   Rexford Maus, DO  levocetirizine (XYZAL) 5 MG tablet Take 1 tablet (5 mg total) by mouth every evening. 11/28/20   Bing Neighbors, NP  ondansetron (ZOFRAN) 4 MG tablet Take 1 tablet (4 mg total) by mouth every 6 (six) hours. 07/29/22   Elayne Snare K, DO  ondansetron (ZOFRAN-ODT) 4 MG disintegrating tablet Take 1 tablet (4 mg total) by mouth every 8 (eight) hours as needed for nausea or vomiting. 10/25/22   Gustavus Bryant, FNP  promethazine-dextromethorphan (PROMETHAZINE-DM) 6.25-15 MG/5ML syrup Take 5 mLs by mouth 4 (four) times daily as needed for cough. 07/01/22   Ellsworth Lennox, PA-C      Allergies    Patient has no known allergies.    Review of Systems   Review of Systems  Skin:  Positive for wound.  Neurological:  Positive for headaches. Negative for  dizziness, light-headedness and numbness.  All other systems reviewed and are negative.   Physical Exam Updated Vital Signs BP 133/87 (BP Location: Left Arm)   Pulse (!) 41   Temp 98.3 F (36.8 C) (Oral)   Resp 18   Ht 1.727 m ( )   Wt 52.2 kg   SpO2 100%   BMI 17.49 kg/m  Physical Exam Vitals and nursing note reviewed.  Constitutional:      Appearance: He is well-developed.     Comments: ABCs intact  HENT:     Head: Normocephalic.     Comments: Abrasion over the forehead    Nose: Nose normal.     Mouth/Throat:     Mouth: Mucous membranes are moist.  Eyes:     Extraocular Movements: Extraocular movements intact.     Pupils: Pupils are equal, round, and reactive to light.  Cardiovascular:     Rate and Rhythm: Normal rate and regular rhythm.     Heart sounds: Normal heart sounds. No murmur heard. Pulmonary:     Effort: Pulmonary effort is normal. No respiratory distress.     Breath sounds: Normal breath sounds.  Abdominal:     Palpations: Abdomen is soft.     Tenderness: There is no abdominal tenderness. There is no rebound.  Musculoskeletal:  Cervical back: Neck supple.  Lymphadenopathy:     Cervical: No cervical adenopathy.  Skin:    General: Skin is warm and dry.  Neurological:     Mental Status: He is alert and oriented to person, place, and time.     Comments: Cranial nerves II through XII intact, 5 out of 5 strength in all 4 extremities, no dysmetria to finger-nose-finger  Psychiatric:        Mood and Affect: Mood normal.     ED Results / Procedures / Treatments   Labs (all labs ordered are listed, but only abnormal results are displayed) Labs Reviewed - No data to display  EKG None  Radiology CT Head Wo Contrast  Result Date: 11/06/2022 CLINICAL DATA:  Fall 2 days ago.  Head strike. EXAM: CT HEAD WITHOUT CONTRAST TECHNIQUE: Contiguous axial images were obtained from the base of the skull through the vertex without intravenous contrast.  RADIATION DOSE REDUCTION: This exam was performed according to the departmental dose-optimization program which includes automated exposure control, adjustment of the mA and/or kV according to patient size and/or use of iterative reconstruction technique. COMPARISON:  CT head 10/05/2020 FINDINGS: Brain: There is no acute intracranial hemorrhage, extra-axial fluid collection, or acute infarct. Parenchymal volume is normal. The ventricles are normal in size. Gray-white differentiation is preserved. The pituitary and suprasellar region are normal. There is no mass lesion. There is no mass effect or midline shift. Vascular: No hyperdense vessel or unexpected calcification. Skull: Normal. Negative for fracture or focal lesion. Sinuses/Orbits: There is mild mucosal thickening in the right posterior ethmoid air cell. The imaged globes and orbits are unremarkable. Other: None. IMPRESSION: No acute intracranial pathology. Electronically Signed   By: Lesia Hausen M.D.   On: 11/06/2022 14:28    Procedures Procedures    Medications Ordered in ED Medications  ibuprofen (ADVIL) tablet 600 mg (has no administration in time range)    ED Course/ Medical Decision Making/ A&P Clinical Course as of 11/06/22 2015  Thu Nov 06, 2022  1901 CT Head Wo Contrast [JR]    Clinical Course User Index [JR] Gareth Eagle, PA-C                             Medical Decision Making  This patient presents to the ED for concern of head injury, this involves an extensive number of treatment options, and is a complaint that carries with it a high risk of complications and morbidity.  I considered the following differential and admission for this acute, potentially life threatening condition.  The differential diagnosis includes minor head injury, concussion, intracranial bleed  MDM:    This a 22 year old male who presents after an injury 2 days ago.  He is nontoxic and vital signs are reassuring.  He is neurologically intact.   CT scan was sent from triage.  I have reviewed this.  There is no evidence of acute fracture or head bleed.  He does have an abrasion to his forehead.  He may have a mild concussion but is neurologically intact.  Patient was given ibuprofen.  He was given concussion precautions.  (Labs, imaging, consults)  Labs: I Ordered, and personally interpreted labs.  The pertinent results include: None  Imaging Studies ordered: I ordered imaging studies including CT head I independently visualized and interpreted imaging. I agree with the radiologist interpretation  Additional history obtained from chart review.  External records from outside source obtained and  reviewed including evaluations  Cardiac Monitoring: The patient was not maintained on a cardiac monitor.  If on the cardiac monitor, I personally viewed and interpreted the cardiac monitored which showed an underlying rhythm of: N/A  Reevaluation: After the interventions noted above, I reevaluated the patient and found that they have :stayed the same  Social Determinants of Health:  lives independently  Disposition: Discharge  Co morbidities that complicate the patient evaluation  Past Medical History:  Diagnosis Date   Retained orthopedic hardware 06/2016   right ankle     Medicines Meds ordered this encounter  Medications   ibuprofen (ADVIL) tablet 600 mg    I have reviewed the patients home medicines and have made adjustments as needed  Problem List / ED Course: Problem List Items Addressed This Visit   None Visit Diagnoses     Minor head injury, initial encounter    -  Primary   Abrasion                       Final Clinical Impression(s) / ED Diagnoses Final diagnoses:  Minor head injury, initial encounter  Abrasion    Rx / DC Orders ED Discharge Orders     None         Shon Baton, MD 11/06/22 2020

## 2022-11-06 NOTE — ED Provider Triage Note (Signed)
Emergency Medicine Provider Triage Evaluation Note  Gabriel Hughes , a 22 y.o. male  was evaluated in triage.  Pt complains of head injury.  Patient ports he tripped and fell forward striking his head on concrete.  He reports a large scrape to the forehead as well as a few scratches elsewhere.  Denies pain elsewhere but reports that he has had a persistent headache and some tenderness.  Review of Systems  Positive: Headache, tinnitus Negative: Numbness, weakness, visual changes, dizziness, neck pain  Physical Exam  BP (!) 131/100 (BP Location: Right Arm)   Pulse (!) 57   Temp 98.9 F (37.2 C)   Resp 16   SpO2 98%  Gen:   Awake, no distress   Resp:  Normal effort  MSK:   Moves extremities without difficulty  Other:  Large scab over the anterior forehead with underlying subtle hematoma, no step-off.  No focal neurologic deficits.  Medical Decision Making  Medically screening exam initiated at 1:17 PM.  Appropriate orders placed.  Gabriel Hughes was informed that the remainder of the evaluation will be completed by another provider, this initial triage assessment does not replace that evaluation, and the importance of remaining in the ED until their evaluation is complete.  CT ordered for persistent HA post head injury   Gabriel Lodge, PA-C 11/06/22 1325

## 2022-11-06 NOTE — Discharge Instructions (Signed)
You were seen today for a head injury.  Your CT scan is negative.  If you develop persistent headache, fogginess, nausea, you may have a mild concussion.  See concussion precautions provided.  Make sure that you are resting and creasing stimulus.  You may take ibuprofen for pain.

## 2022-11-06 NOTE — ED Triage Notes (Signed)
Pt arrived POV from home c/o falling 2 days ago and hitting his head and now is having pain in his head. Pt has an abrasion on the left knee and on his forehead.

## 2023-02-04 ENCOUNTER — Other Ambulatory Visit: Payer: Self-pay

## 2023-02-04 ENCOUNTER — Emergency Department (HOSPITAL_COMMUNITY)
Admission: EM | Admit: 2023-02-04 | Discharge: 2023-02-04 | Disposition: A | Discharge status: Home / Self Care | Payer: Medicaid Other | Attending: Emergency Medicine | Admitting: Emergency Medicine

## 2023-02-04 ENCOUNTER — Emergency Department (HOSPITAL_COMMUNITY)
Admission: EM | Admit: 2023-02-04 | Discharge: 2023-02-04 | Disposition: A | Discharge status: Home / Self Care | Payer: Medicaid Other | Source: Home / Self Care

## 2023-02-04 ENCOUNTER — Encounter (HOSPITAL_COMMUNITY): Payer: Self-pay

## 2023-02-04 DIAGNOSIS — K644 Residual hemorrhoidal skin tags: Secondary | ICD-10-CM | POA: Insufficient documentation

## 2023-02-04 DIAGNOSIS — F17200 Nicotine dependence, unspecified, uncomplicated: Secondary | ICD-10-CM | POA: Insufficient documentation

## 2023-02-04 DIAGNOSIS — K649 Unspecified hemorrhoids: Secondary | ICD-10-CM | POA: Insufficient documentation

## 2023-02-04 DIAGNOSIS — K6289 Other specified diseases of anus and rectum: Secondary | ICD-10-CM | POA: Diagnosis present

## 2023-02-04 MED ORDER — HYDROCORTISONE (PERIANAL) 2.5 % EX CREA: 1.0000 | TOPICAL_CREAM | Freq: Two times a day (BID) | CUTANEOUS | 0 refills | Status: DC

## 2023-02-04 MED ORDER — BISACODYL 5 MG PO TBEC: 5.0000 mg | DELAYED_RELEASE_TABLET | Freq: Two times a day (BID) | ORAL | 0 refills | Status: AC

## 2023-02-04 NOTE — ED Triage Notes (Signed)
Pt states for the past few days he has discomfort on is bottom. Pt has been using OTC cream and wipes but no relief.

## 2023-02-04 NOTE — Discharge Instructions (Addendum)
Please pick up the bisacodyl (Dulcolax), a stool softener in your pharmacy and take as prescribed.  Please refrain from raining when using the restroom as this can make your hemorrhoid worse.  You have also been prescribed a hydrocortisone cream (Anusol-HC) to apply to your hemorrhoid twice a day for the next week.  Do not take this for longer than 1 week.  You may also try soaking your bottom in warm water/bath a couple times a day to help with the pain.  Please call the number provided for the surgeon to schedule an appointment to discuss your hemorrhoid.  Please return to the ER if you have uncontrolled rectal bleeding, significant pain, rest pain, shortness of breath, any other new or concerning symptoms.

## 2023-02-04 NOTE — ED Triage Notes (Signed)
Pt was evaluated in ED earlier today for hemorrhoids and told that he needs to have surgical intervention. Pt called surgeon who stated first available appointment was in three weeks and pt would like reassessment d/t pain.

## 2023-02-04 NOTE — ED Provider Notes (Signed)
[place] EMERGENCY DEPARTMENT AT [facility] Provider Note   CSN: [id number] Arrival date & time: 02/04/23  1216     History  Chief Complaint  Patient presents with   Hemorrhoids    Gabriel Hughes is a 22 y.o. male.  He was seen earlier today by Dr. Rosalia Hammers for hemorrhoids.  He has had pain around the anal region for about 3 or 4 days.  He denies any bleeding.  He was given a referral for the surgery center and prescription for Bisacodyl. Reports the earliest appointment was 3 weeks away and did not want to wait that long for treatment which prompted him to come back in.  Denies any constipation.  Denies any history of hemorrhoids.  HPI     Home Medications Prior to Admission medications   Medication Sig Start Date End Date Taking? Authorizing Provider  amoxicillin (AMOXIL) 500 MG capsule Take 1 capsule (500 mg total) by mouth 3 (three) times daily. 09/20/21   Tomi Bamberger, PA-C  benzonatate (TESSALON) 200 MG capsule Take 1 capsule (200 mg total) by mouth 3 (three) times daily as needed for cough. 07/27/22   Radford Pax, NP  bisacodyl (DULCOLAX) 5 MG EC tablet Take 1 tablet (5 mg total) by mouth 2 (two) times daily. 02/04/23   Margarita Grizzle, MD  dicyclomine (BENTYL) 20 MG tablet Take 1 tablet (20 mg total) by mouth 2 (two) times daily. 07/29/22   Rexford Maus, DO  levocetirizine (XYZAL) 5 MG tablet Take 1 tablet (5 mg total) by mouth every evening. 11/28/20   Bing Neighbors, NP  ondansetron (ZOFRAN) 4 MG tablet Take 1 tablet (4 mg total) by mouth every 6 (six) hours. 07/29/22   Elayne Snare K, DO  ondansetron (ZOFRAN-ODT) 4 MG disintegrating tablet Take 1 tablet (4 mg total) by mouth every 8 (eight) hours as needed for nausea or vomiting. 10/25/22   Gustavus Bryant, FNP  promethazine-dextromethorphan (PROMETHAZINE-DM) 6.25-15 MG/5ML syrup Take 5 mLs by mouth 4 (four) times daily as needed for cough. 07/01/22   Ellsworth Lennox, PA-C      Allergies    Patient has  no known allergies.    Review of Systems   Review of Systems  Genitourinary:        Anal pain    Physical Exam Updated Vital Signs BP 122/88 (BP Location: Right Arm)   Pulse (!) 53   Temp 98.4 F (36.9 C) (Oral)   Resp 14   Ht 5\' 8"  (1.727 m)   Wt 54.4 kg   SpO2 96%   BMI 18.25 kg/m  Physical Exam Vitals and nursing note reviewed. Exam conducted with a chaperone present.  Constitutional:      Appearance: Normal appearance.  HENT:     Head: Atraumatic.  Pulmonary:     Effort: Pulmonary effort is normal.  Genitourinary:    Comments: Large external hemorrhoid, no obvious thrombosis.  No erythema.  Neurological:     General: No focal deficit present.     Mental Status: He is alert.  Psychiatric:        Mood and Affect: Mood normal.        Behavior: Behavior normal.     ED Results / Procedures / Treatments   Labs (all labs ordered are listed, but only abnormal results are displayed) Labs Reviewed - No data to display  EKG None  Radiology No results found.  Procedures Procedures    Medications Ordered in ED Medications -  No data to display  ED Course/ Medical Decision Making/ A&P                             Medical Decision Making Risk Prescription drug management.   22 y.o. male presents to the ED for concern of hemorrhoid  Differential diagnosis includes but is not limited to hemorrhoid, anal fissure  ED Course:  Patient was evaluated earlier this morning by Dr. Rosalia Hammers for his hemorrhoid.  A plan at that time was agreed upon with patient opting for conservative management.  He was prescribed bisacodyl and told to schedule appointment with surgery.  He reports the wait for surgery was 3 weeks out so this prompted him to come back in.  He has an external hemorrhoid on exam consistent with Dr. Denny Levy earlier assessment.  Since the hemorrhoid has been painful, prescribed hydrocortisone cream to be applied to the area for no longer than 1 week as needed for  pain.  We discussed that he still needs to keep his appointment with the surgery team and to use the bisacodyl as prescribed.  Discussed that he needs to refrain from straining while on the toilet and may use warm sitz bath's to help.  Impression: External hemorrhoid  Disposition:  The patient was discharged home with instructions to use bisacodyl and hydrocortisone cream as prescribed.  Attend appointment with the surgery team. Return precautions given.    External records from outside source obtained and reviewed including ER visit from earlier today          Final Clinical Impression(s) / ED Diagnoses Final diagnoses:  None    Rx / DC Orders ED Discharge Orders     None         Arabella Merles, PA-C 02/04/23 2319    Gloris Manchester, MD 02/07/23 [phone]

## 2023-02-04 NOTE — ED Provider Notes (Signed)
[place] EMERGENCY DEPARTMENT AT [facility] Provider Note   CSN: [id number] Arrival date & time: 02/04/23  5784     History  Chief Complaint  Patient presents with   Hemorrhoids    Gabriel Hughes is a 22 y.o. male.  HPI 22 year old male presents today complaining of hemorrhoids.  He noted some swelling in the anal area over the past several days.  He has been using over-the-counter cream and wipes.  He denies constipation.  He denies taking any medications.  He does smoke and drink.    Home Medications Prior to Admission medications   Medication Sig Start Date End Date Taking? Authorizing Provider  bisacodyl (DULCOLAX) 5 MG EC tablet Take 1 tablet (5 mg total) by mouth 2 (two) times daily. 02/04/23  Yes Margarita Grizzle, MD  amoxicillin (AMOXIL) 500 MG capsule Take 1 capsule (500 mg total) by mouth 3 (three) times daily. 09/20/21   Tomi Bamberger, PA-C  benzonatate (TESSALON) 200 MG capsule Take 1 capsule (200 mg total) by mouth 3 (three) times daily as needed for cough. 07/27/22   Radford Pax, NP  dicyclomine (BENTYL) 20 MG tablet Take 1 tablet (20 mg total) by mouth 2 (two) times daily. 07/29/22   Rexford Maus, DO  levocetirizine (XYZAL) 5 MG tablet Take 1 tablet (5 mg total) by mouth every evening. 11/28/20   Bing Neighbors, NP  ondansetron (ZOFRAN) 4 MG tablet Take 1 tablet (4 mg total) by mouth every 6 (six) hours. 07/29/22   Elayne Snare K, DO  ondansetron (ZOFRAN-ODT) 4 MG disintegrating tablet Take 1 tablet (4 mg total) by mouth every 8 (eight) hours as needed for nausea or vomiting. 10/25/22   Gustavus Bryant, FNP  promethazine-dextromethorphan (PROMETHAZINE-DM) 6.25-15 MG/5ML syrup Take 5 mLs by mouth 4 (four) times daily as needed for cough. 07/01/22   Ellsworth Lennox, PA-C      Allergies    Patient has no known allergies.    Review of Systems   Review of Systems  Physical Exam Updated Vital Signs BP (!) 145/97 (BP Location: Right Arm)   Pulse  92   Temp 98 F (36.7 C) (Oral)   Resp 17   Ht 1.727 m (5\' 8" )   Wt 54.4 kg   SpO2 100%   BMI 18.25 kg/m  Physical Exam Vitals and nursing note reviewed.  HENT:     Head: Normocephalic.     Right Ear: External ear normal.     Nose: Nose normal.     Mouth/Throat:     Pharynx: Oropharynx is clear.  Cardiovascular:     Rate and Rhythm: Normal rate.  Pulmonary:     Effort: Pulmonary effort is normal.  Abdominal:     Palpations: Abdomen is soft.  Genitourinary:    Comments: Rectum examined with hemorrhoid present No surrounding erythema or induration Musculoskeletal:     Cervical back: Normal range of motion.  Skin:    Capillary Refill: Capillary refill takes less than 2 seconds.  Neurological:     General: No focal deficit present.     Mental Status: He is alert.  Psychiatric:        Mood and Affect: Mood normal.     ED Results / Procedures / Treatments   Labs (all labs ordered are listed, but only abnormal results are displayed) Labs Reviewed - No data to display  EKG None  Radiology No results found.  Procedures Procedures    Medications Ordered in  ED Medications - No data to display  ED Course/ Medical Decision Making/ A&P                             Medical Decision Making  22 year old male presents today with rectal pain consistent with thrombosed hemorrhoid.  I have discussed conservative versus acute intervention.  Patient wishes to proceed with conservative therapy at this time.  He is advised regarding stool softener and will be given prescription. He is given referral outpatient to surgery.  We discussed return precautions need for follow-up and he voiced understanding.        Final Clinical Impression(s) / ED Diagnoses Final diagnoses:  Hemorrhoids, unspecified hemorrhoid type    Rx / DC Orders ED Discharge Orders          Ordered    bisacodyl (DULCOLAX) 5 MG EC tablet  2 times daily        02/04/23 0942               Margarita Grizzle, MD 02/04/23 [phone]

## 2024-02-19 ENCOUNTER — Emergency Department (HOSPITAL_COMMUNITY): Admission: EM | Admit: 2024-02-19 | Discharge: 2024-02-19 | Disposition: A | Discharge status: Home / Self Care

## 2024-02-19 ENCOUNTER — Encounter (HOSPITAL_COMMUNITY): Payer: Self-pay

## 2024-02-19 ENCOUNTER — Other Ambulatory Visit: Payer: Self-pay

## 2024-02-19 DIAGNOSIS — K644 Residual hemorrhoidal skin tags: Secondary | ICD-10-CM | POA: Diagnosis not present

## 2024-02-19 DIAGNOSIS — K625 Hemorrhage of anus and rectum: Secondary | ICD-10-CM | POA: Diagnosis present

## 2024-02-19 LAB — COMPREHENSIVE METABOLIC PANEL WITH GFR
ALT: 27 U/L (ref 0–44)
AST: 31 U/L (ref 15–41)
Albumin: 3.6 g/dL (ref 3.5–5.0)
Alkaline Phosphatase: 82 U/L (ref 38–126)
Anion gap: 6 (ref 5–15)
BUN: 19 mg/dL (ref 6–20)
CO2: 24 mmol/L (ref 22–32)
Calcium: 8.9 mg/dL (ref 8.9–10.3)
Chloride: 109 mmol/L (ref 98–111)
Creatinine, Ser: 1.09 mg/dL (ref 0.61–1.24)
GFR, Estimated: >60 mL/min (ref >60)
Glucose, Bld: 94 mg/dL (ref 70–99)
Potassium: 3.7 mmol/L (ref 3.5–5.1)
Sodium: 139 mmol/L (ref 135–145)
Total Bilirubin: 0.8 mg/dL (ref 0.0–1.2)
Total Protein: 6.8 g/dL (ref 6.5–8.1)

## 2024-02-19 LAB — CBC
HCT: 45.2 % (ref 39.0–52.0)
Hemoglobin: 14.9 g/dL (ref 13.0–17.0)
MCH: 30.9 pg (ref 26.0–34.0)
MCHC: 33 g/dL (ref 30.0–36.0)
MCV: 93.8 fL (ref 80.0–100.0)
Platelets: 143 K/uL — ABNORMAL LOW (ref 150–400)
RBC: 4.82 MIL/uL (ref 4.22–5.81)
RDW: 12.3 % (ref 11.5–15.5)
WBC: 5.2 K/uL (ref 4.0–10.5)
nRBC: 0 % (ref 0.0–0.2)

## 2024-02-19 LAB — POC OCCULT BLOOD, ED: Fecal Occult Bld: POSITIVE — AB

## 2024-02-19 MED ORDER — HYDROCORTISONE (PERIANAL) 2.5 % EX CREA: 1.0000 | TOPICAL_CREAM | Freq: Two times a day (BID) | CUTANEOUS | 0 refills | Status: AC

## 2024-02-19 MED ORDER — DOCUSATE SODIUM 100 MG PO CAPS: 100.0000 mg | ORAL_CAPSULE | Freq: Two times a day (BID) | ORAL | 0 refills | Status: AC

## 2024-02-19 NOTE — ED Provider Notes (Signed)
 [place] EMERGENCY DEPARTMENT AT [facility] Provider Note   CSN: [id number] Arrival date & time: 02/19/24  1444     Patient presents with: Hemorrhoids and Rectal Bleeding   Gabriel Hughes is a 23 y.o. male.  Patient past history significant for ADHD, POTS, and alcohol use presents emergency department concerns of rectal bleeding.  He reports he noticed rectal bleeding around 4 AM this morning that he describes as largely painless.  States that he has noticed bright red blood when he wipes as well as dark stool. Denies hematemesis. Denies any anticoagulant use, aspirin use, or high-dose ibuprofen  use.  Does endorse somewhat frequent alcohol use and primarily drinks liquor.  Endorses some marijuana use but denies any other substance use.    Rectal Bleeding      Prior to Admission medications   Medication Sig Start Date End Date Taking? Authorizing Provider  docusate sodium (COLACE) 100 MG capsule Take 1 capsule (100 mg total) by mouth every 12 (twelve) hours. 02/19/24  Yes Anjanae Woehrle A, PA-C  hydrocortisone  (ANUSOL -HC) 2.5 % rectal cream Place 1 Application rectally 2 (two) times daily. 02/19/24  Yes Xitlaly Ault A, PA-C  amoxicillin  (AMOXIL ) 500 MG capsule Take 1 capsule (500 mg total) by mouth 3 (three) times daily. 09/20/21   Billy Asberry FALCON, PA-C  benzonatate  (TESSALON ) 200 MG capsule Take 1 capsule (200 mg total) by mouth 3 (three) times daily as needed for cough. 07/27/22   Mayer, Jodi R, NP  bisacodyl  (DULCOLAX) 5 MG EC tablet Take 1 tablet (5 mg total) by mouth 2 (two) times daily. 02/04/23   Levander Houston, MD  dicyclomine  (BENTYL ) 20 MG tablet Take 1 tablet (20 mg total) by mouth 2 (two) times daily. 07/29/22   Kingsley, Victoria K, DO  levocetirizine (XYZAL ) 5 MG tablet Take 1 tablet (5 mg total) by mouth every evening. 11/28/20   Arloa Suzen RAMAN, NP  ondansetron  (ZOFRAN ) 4 MG tablet Take 1 tablet (4 mg total) by mouth every 6 (six) hours. 07/29/22   Kingsley, Victoria  K, DO  ondansetron  (ZOFRAN -ODT) 4 MG disintegrating tablet Take 1 tablet (4 mg total) by mouth every 8 (eight) hours as needed for nausea or vomiting. 10/25/22   Hazen Darryle BRAVO, FNP  promethazine -dextromethorphan (PROMETHAZINE -DM) 6.25-15 MG/5ML syrup Take 5 mLs by mouth 4 (four) times daily as needed for cough. 07/01/22   Lynwood Lenis, PA-C    Allergies: Patient has no known allergies.    Review of Systems  Gastrointestinal:  Positive for blood in stool and hematochezia.  All other systems reviewed and are negative.   Updated Vital Signs BP 139/89 (BP Location: Right Arm)   Pulse (!) 55   Temp 98.5 F (36.9 C) (Oral)   Resp 18   SpO2 99%   Physical Exam Vitals and nursing note reviewed.  Constitutional:      General: He is not in acute distress.    Appearance: He is well-developed.  HENT:     Head: Normocephalic and atraumatic.  Eyes:     Conjunctiva/sclera: Conjunctivae normal.  Cardiovascular:     Rate and Rhythm: Normal rate and regular rhythm.     Heart sounds: No murmur heard. Pulmonary:     Effort: Pulmonary effort is normal. No respiratory distress.     Breath sounds: Normal breath sounds.  Abdominal:     Palpations: Abdomen is soft.     Tenderness: There is no abdominal tenderness.  Musculoskeletal:  General: No swelling.     Cervical back: Neck supple.  Skin:    General: Skin is warm and dry.     Capillary Refill: Capillary refill takes less than 2 seconds.  Neurological:     Mental Status: He is alert.  Psychiatric:        Mood and Affect: Mood normal.     (all labs ordered are listed, but only abnormal results are displayed) Labs Reviewed  CBC - Abnormal; Notable for the following components:      Result Value   Platelets 143 (*)    All other components within normal limits  POC OCCULT BLOOD, ED - Abnormal; Notable for the following components:   Fecal Occult Bld POSITIVE (*)    All other components within normal limits  COMPREHENSIVE  METABOLIC PANEL WITH GFR    EKG: None  Radiology: No results found.   Procedures   Medications Ordered in the ED - No data to display                                  Medical Decision Making Amount and/or Complexity of Data Reviewed Labs: ordered.   This patient presents to the ED for concern of rectal bleeding, this involves an extensive number of treatment options, and is a complaint that carries with it a high risk of complications and morbidity.  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  The pertinent results include: CBC with stable hemoglobin at 14.9, CMP unremarkable, Hemoccult positive   Consultations Obtained:  I requested consultation with none,  and discussed lab and imaging findings as well as pertinent plan - they recommend: N/A   Problem List / ED Course / Critical interventions / Medication management  Patient with past history significant for POTS, Luetscher's syndrome, hemorrhoids, presents to the emergency department with concerns of hemorrhoids and rectal bleeding.  He reports that he woke up this morning around 4 AM with rectal bleeding.  Describes this mostly as painless.  No past history of acute GI bleed.  Denies any anticoagulant use.  Does endorse somewhat heavy alcohol use typically drinking hard liquor's.  No reported hematemesis.  Describes stool as bright blood per rectum when he wipes as well as some dark stool itself. On exam, patient without concerning heart or lung sounds.  Abdomen is nontender.  Chaperoned rectal exam reveals external hemorrhoid not acutely thrombosed and no obvious frank blood on exam.  Will proceed with CBC, CMP, and Hemoccult. CBC and CMP unremarkable.  No skin signs of blood loss.  Platelets unremarkable at 143  consistent with level from 1 year prior.  Hemoccult is positive.  I suspect this is likely secondary to patient's hemorrhoid that is seen on exam. Oaklyn score is 7, stable for outpatient follow-up.  Suspect bleeding finding is likely secondary to patient's external hemorrhoid.  Return precautions discussed such as concerns for new or worsening symptoms.  Discharged home with instructions to use MiraLAX, Colace, and a topical steroid, Anusol , to help address the hemorrhoid that is seen.  Otherwise stable for outpatient follow-up and discharged home. I have reviewed the patients home medicines and have made adjustments as needed   Social Determinants of Health:  Alcohol use   Test / Admission -  Considered:  Consider admission but stable for outpatient follow-up as patient is not currently meeting inpatient criteria.  Final diagnoses:  External hemorrhoid    ED Discharge Orders          Ordered    docusate sodium (COLACE) 100 MG capsule  Every 12 hours        02/19/24 1758    hydrocortisone  (ANUSOL -HC) 2.5 % rectal cream  2 times daily        02/19/24 1758               Charlissa Petros A, PA-C 02/19/24 1804    Neysa Caron PARAS, DO 02/19/24 2332

## 2024-02-19 NOTE — ED Triage Notes (Signed)
 Pt states that he has had rectal bleeding since this morning. Pt has a hx of hemorrhoids.

## 2024-02-19 NOTE — Discharge Instructions (Addendum)
 You were seen in the ER today for concerns of hemorrhoids and rectal bleeding. Your exam did reveal what appears to be an external hemorrhoid. Your labs were thankfully reassuring without any obvious abnormalities seen although your hemoccult was positive which indicate some blood on your rectal exam. I suspect this is likely from the hemorrhoid you currently are dealing with. Try to reduce straining with bowel movements by staying hydrated and increasing fiber intake. I have sent a prescription for a stool softener and topical steroid to help with your hemorrhoid. For concerns of new or worsening symptoms, return to the ER.

## 2024-04-12 ENCOUNTER — Ambulatory Visit
Admission: EM | Admit: 2024-04-12 | Discharge: 2024-04-12 | Disposition: A | Discharge status: Home / Self Care | Attending: Family Medicine | Admitting: Family Medicine

## 2024-04-12 DIAGNOSIS — R070 Pain in throat: Secondary | ICD-10-CM | POA: Diagnosis present

## 2024-04-12 DIAGNOSIS — A084 Viral intestinal infection, unspecified: Secondary | ICD-10-CM | POA: Insufficient documentation

## 2024-04-12 LAB — POCT RAPID STREP A (OFFICE): Rapid Strep A Screen: NEGATIVE

## 2024-04-12 MED ORDER — LOPERAMIDE HCL 2 MG PO CAPS
2.0000 mg | ORAL_CAPSULE | Freq: Two times a day (BID) | ORAL | 0 refills | Status: AC | PRN
Start: 2024-04-12 — End: ?

## 2024-04-12 MED ORDER — ONDANSETRON 8 MG PO TBDP: 8.0000 mg | ORAL_TABLET | Freq: Three times a day (TID) | ORAL | 0 refills | Status: AC | PRN

## 2024-04-12 NOTE — ED Triage Notes (Signed)
 Pt reports sore throat and abdominal pain since this morning. Pt has not taken any meds for complaints.

## 2024-04-12 NOTE — Discharge Instructions (Signed)

## 2024-04-12 NOTE — ED Provider Notes (Signed)
 [facility] - URGENT CARE CENTER  Note:  This document was prepared using Conservation officer, historic buildings and may include unintentional dictation errors.  MRN: [id number] DOB: 28-Jan-2001  Subjective:   Gabriel Hughes is a 23 y.o. male presenting for acute onset this morning of throat pain, nausea, vomiting (1 episode), diarrhea (2 episodes), some coughing and slight runny/stuffy nose. No fever, chest pain, shob, wheezing. No asthma. No bloody stools, recent antibiotic use, hospitalizations or long distance travel.  Has not eaten raw foods, drank unfiltered water.  No history of GI disorders including Crohn's, IBS, ulcerative colitis. Smokes marijuana a few times a week, smokes Black & Mild cigars, vapes.   No current facility-administered medications for this encounter.  Current Outpatient Medications:    amoxicillin  (AMOXIL ) 500 MG capsule, Take 1 capsule (500 mg total) by mouth 3 (three) times daily., Disp: 21 capsule, Rfl: 0   benzonatate  (TESSALON ) 200 MG capsule, Take 1 capsule (200 mg total) by mouth 3 (three) times daily as needed for cough., Disp: 20 capsule, Rfl: 0   bisacodyl  (DULCOLAX) 5 MG EC tablet, Take 1 tablet (5 mg total) by mouth 2 (two) times daily., Disp: 14 tablet, Rfl: 0   dicyclomine  (BENTYL ) 20 MG tablet, Take 1 tablet (20 mg total) by mouth 2 (two) times daily., Disp: 20 tablet, Rfl: 0   docusate sodium  (COLACE) 100 MG capsule, Take 1 capsule (100 mg total) by mouth every 12 (twelve) hours., Disp: 60 capsule, Rfl: 0   hydrocortisone  (ANUSOL -HC) 2.5 % rectal cream, Place 1 Application rectally 2 (two) times daily., Disp: 30 g, Rfl: 0   levocetirizine (XYZAL ) 5 MG tablet, Take 1 tablet (5 mg total) by mouth every evening., Disp: 90 tablet, Rfl: 0   ondansetron  (ZOFRAN ) 4 MG tablet, Take 1 tablet (4 mg total) by mouth every 6 (six) hours., Disp: 12 tablet, Rfl: 0   ondansetron  (ZOFRAN -ODT) 4 MG disintegrating tablet, Take 1 tablet (4 mg total) by mouth every 8  (eight) hours as needed for nausea or vomiting., Disp: 20 tablet, Rfl: 0   promethazine -dextromethorphan (PROMETHAZINE -DM) 6.25-15 MG/5ML syrup, Take 5 mLs by mouth 4 (four) times daily as needed for cough., Disp: 120 mL, Rfl: 0   No Known Allergies  Past Medical History:  Diagnosis Date   Retained orthopedic hardware 06/2016   right ankle     Past Surgical History:  Procedure Laterality Date   CAST APPLICATION Right 01/13/2016   Procedure: CAST APPLICATION;  Surgeon: Donaciano Sprang, MD;  Location: [facility] OR;  Service: Orthopedics;  Laterality: Right;   HARDWARE REMOVAL Right 07/03/2016   Procedure: Removal of deep implants Right Ankle;  Surgeon: Norleen Armor, MD;  Location: [facility];  Service: Orthopedics;  Laterality: Right;   I & D EXTREMITY Right 01/13/2016   Procedure: IRRIGATION AND DEBRIDEMENT RIGHT ANKLE;  Surgeon: Donaciano Sprang, MD;  Location: [facility] OR;  Service: Orthopedics;  Laterality: Right;   I & D EXTREMITY Right 01/15/2016   Procedure: IRRIGATION AND DEBRIDEMENT OPEN FRACTURE ;  Surgeon: Norleen Armor, MD;  Location: [facility] OR;  Service: Orthopedics;  Laterality: Right;   OPEN REDUCTION INTERNAL FIXATION (ORIF) DISTAL RADIAL FRACTURE Right 01/15/2016   Procedure: OPEN REDUCTION INTERNAL FIXATION (ORIF) RIGHT DISTAL TIBIAL RADIAL FRACTURE;  Surgeon: Norleen Armor, MD;  Location: [facility] OR;  Service: Orthopedics;  Laterality: Right;    History reviewed. No pertinent family history.  Social History   Tobacco Use   Smoking status: Never    Passive exposure: Yes  Smokeless tobacco: Never   Tobacco comments:    father smokes outside  Vaping Use   Vaping status: Some Days  Substance Use Topics   Alcohol use: No   Drug use: Yes    Types: Marijuana    Comment: 3 times a week    ROS   Objective:   Vitals: BP 122/71 (BP Location: Right Arm)   Pulse (!) 56   Temp 98.7 F (37.1 C) (Oral)   Resp 16   SpO2 96%   Physical Exam Constitutional:      General: He is  not in acute distress.    Appearance: Normal appearance. He is well-developed and normal weight. He is not ill-appearing, toxic-appearing or diaphoretic.  HENT:     Head: Normocephalic and atraumatic.     Right Ear: External ear normal.     Left Ear: External ear normal.     Nose: Nose normal.     Mouth/Throat:     Mouth: Mucous membranes are moist.     Pharynx: No pharyngeal swelling, oropharyngeal exudate, posterior oropharyngeal erythema or uvula swelling.     Tonsils: No tonsillar exudate or tonsillar abscesses. 0 on the right. 0 on the left.  Eyes:     General: No scleral icterus.       Right eye: No discharge.        Left eye: No discharge.     Extraocular Movements: Extraocular movements intact.     Conjunctiva/sclera: Conjunctivae normal.  Cardiovascular:     Rate and Rhythm: Normal rate and regular rhythm.     Heart sounds: No murmur heard.    No friction rub. No gallop.  Pulmonary:     Effort: Pulmonary effort is normal. No respiratory distress.     Breath sounds: No wheezing or rales.  Abdominal:     General: Bowel sounds are increased. There is no distension.     Palpations: Abdomen is soft. There is no mass.     Tenderness: There is generalized abdominal tenderness (mild, tolerated exam well). There is no right CVA tenderness, left CVA tenderness, guarding or rebound.  Musculoskeletal:     Cervical back: Normal range of motion.  Skin:    General: Skin is warm and dry.  Neurological:     Mental Status: He is alert and oriented to person, place, and time.  Psychiatric:        Mood and Affect: Mood normal.        Behavior: Behavior normal.        Thought Content: Thought content normal.        Judgment: Judgment normal.     Results for orders placed or performed during the hospital encounter of 04/12/24 (from the past 24 hours)  POCT rapid strep A     Status: None   Collection Time: 04/12/24  1:34 PM  Result Value Ref Range   Rapid Strep A Screen Negative  Negative    Assessment and Plan :   PDMP not reviewed this encounter.  1. Viral gastroenteritis   2. Throat pain    Strep culture pending. Will manage for suspected viral gastroenteritis with supportive care.  Recommended patient hydrate well, eat light meals and maintain electrolytes.  Will use Zofran  and Imodium  for nausea, vomiting and diarrhea. Counseled patient on potential for adverse effects with medications prescribed/recommended today, ER and return-to-clinic precautions discussed, patient verbalized understanding.    Christopher Savannah, [place] 04/12/24 1349

## 2024-04-15 ENCOUNTER — Ambulatory Visit (HOSPITAL_COMMUNITY): Payer: Self-pay

## 2024-04-15 LAB — CULTURE, GROUP A STREP (THRC)
